# Patient Record
Sex: Male | Born: 1995 | Race: White | Hispanic: No | Marital: Single | Smoking: Never smoker
Health system: Southern US, Community
[De-identification: ages and names within clinical notes are randomized; demographics above are authoritative.]

## PROBLEM LIST (undated history)

## (undated) DIAGNOSIS — F909 Attention-deficit hyperactivity disorder, unspecified type: Secondary | ICD-10-CM

## (undated) HISTORY — DX: Attention-deficit hyperactivity disorder, unspecified type: F90.9

---
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
## 2017-06-12 ENCOUNTER — Ambulatory Visit (INDEPENDENT_AMBULATORY_CARE_PROVIDER_SITE_OTHER): Payer: BLUE CROSS/BLUE SHIELD | Admitting: Family

## 2017-06-12 ENCOUNTER — Encounter: Payer: Self-pay | Admitting: Family

## 2017-06-12 VITALS — BP 108/62 | HR 68 | Resp 16 | Ht 71.0 in | Wt 150.0 lb

## 2017-06-12 DIAGNOSIS — F900 Attention-deficit hyperactivity disorder, predominantly inattentive type: Secondary | ICD-10-CM

## 2017-06-12 DIAGNOSIS — Z79899 Other long term (current) drug therapy: Secondary | ICD-10-CM

## 2017-06-12 DIAGNOSIS — Z719 Counseling, unspecified: Secondary | ICD-10-CM | POA: Diagnosis not present

## 2017-06-12 MED ORDER — METHYLPHENIDATE HCL ER (OSM) 27 MG PO TBCR: 27.0000 mg | EXTENDED_RELEASE_TABLET | Freq: Every morning | ORAL | 0 refills | Status: AC

## 2017-06-12 NOTE — Progress Notes (Signed)
[facility] [address], [address] [place] [address] Dept: [phone] Dept Fax: [phone] Loc: [phone] Loc Fax: [phone]  Medical Follow-up  Patient ID: Joshua BoardsNicholas Haynes, male  DOB: 27-Nov-1995, 21 y.o.  MRN: [id number]  Date of Evaluation: 06/12/17  PCP: Joshua HeysEhinger, Robert, MD  Accompanied by: Self Patient Lives with: parents for the summer  HISTORY/CURRENT STATUS:  HPI  Patient here for routine follow up related to ADHD and medication management. Patient here by himself for today's visit and interactive with provider. Did well last year at [employer] with no academic difficulties. Not consistent with taking medication unless he needs it and has not changed doses. To continue with Concerta 27 mg daily with no side effects reported.   EDUCATION: School: [employer] Year/Grade: Sophomore  Performance/Grades: above average Services: Other: tutoring as needed Activities/Exercise: intermittently-working most days this summer at [employer]. Basketball at college most days.   MEDICAL HISTORY: Appetite: Good MVI/Other: Daily Fruits/Vegs:Some Calcium: Some Iron:Some  Sleep: Bedtime: 8 hours/night Sleep Concerns: Initiation/Maintenance/Other: Struggling to fall asleep and remain asleep, especially at school. Melatonin as needed.   Individual Medical History/Review of System Changes? Yes, attacked by bees yesterday and took Benadryl. No recent sicknesses or illnesses reported by patient.   Allergies: Patient has no known allergies.  Current Medications:  Current Outpatient Prescriptions:  .  methylphenidate 27 MG PO CR tablet, Take 1-2 tablets (27-54 mg total) by mouth every morning., Disp: 60 tablet, Rfl: 0 Medication Side Effects: None  Family Medical/Social History Changes?: None reported  MENTAL HEALTH: Mental Health Issues: None  reported  PHYSICAL EXAM: Vitals:  Today's Vitals   06/12/17 1007  BP: 108/62  Pulse: 68  Resp: 16  Weight: 150 lb (68 kg)  Height: 5\' 11"  (1.803 m)  PainSc: 0-No pain  , 25 %ile (Z= -0.66) based on CDC 2-20 Years BMI-for-age data using vitals from 06/12/2017.  General Exam: Physical Exam  Constitutional: He is oriented to person, place, and time. He appears well-developed and well-nourished.  HENT:  Head: Normocephalic and atraumatic.  Right Ear: External ear normal.  Left Ear: External ear normal.  Nose: Nose normal.  Mouth/Throat: Oropharynx is clear and moist.  Eyes: Pupils are equal, round, and reactive to light. Conjunctivae and EOM are normal.  Neck: Trachea normal, normal range of motion and full passive range of motion without pain. Neck supple.  Cardiovascular: Normal rate, regular rhythm, normal heart sounds and intact distal pulses.   Pulmonary/Chest: Effort normal and breath sounds normal.  Abdominal: Soft. Bowel sounds are normal.  Genitourinary:  Genitourinary Comments: Deferred  Musculoskeletal: Normal range of motion.  Neurological: He is alert and oriented to person, place, and time. He has normal reflexes.  Skin: Skin is warm, dry and intact. Capillary refill takes less than 2 seconds.  Psychiatric: He has a normal mood and affect. His behavior is normal. Judgment and thought content normal.  Vitals reviewed.  Review of Systems  Psychiatric/Behavioral: Positive for decreased concentration.  All other systems reviewed and are negative.  No concerns for toileting. Daily stool, no constipation or diarrhea. Void urine no difficulty. No enuresis.   Participate in daily oral hygiene to include brushing and flossing.  Neurological: oriented to time, place, and person Cranial Nerves: normal  Neuromuscular:  Motor Mass: Normal Tone: Normal Strength: Normal DTRs: 2+ and symmetric Overflow: None Reflexes: no tremors noted Sensory Exam: Vibratory: Intact  Fine  Touch:  Intact  Testing/Developmental Screens:  ASRS-did not complete at today's visit  DIAGNOSES:    ICD-10-CM   1. ADHD (attention deficit hyperactivity disorder), inattentive type F90.0   2. Medication management Z79.899   3. Patient counseled Z71.9     RECOMMENDATIONS: 3 month follow up and continuation of medication. Concerta 27 mg daily 1-2 daily, # 60 no refills and printed for patient today. No other refills given to patient.  Counseled patient on medication adherence with his medication. Patient only taking as needed and days with increased lecture time. Discussed need for consistency with his medication.   Reviewed medication safety at school with access being limited by other people in the dorm room and keeping medication in a lock box with only a small amount available at one time.   Information reviewed for increased physical activity during school days with recreational basketball and some other leisure activities. Patient to take Badminton this coming semester, but will continue with other activities as well.   Directed patient to follow up with PCP yearly, dentist as recommended, eye doctor for exam, MVI daily, healthy eating habits, exercise and proper rest for health maintenance.   NEXT APPOINTMENT: Return in about 3 months (around 09/12/2017) for follow up visit.  More than 50% of the appointment was spent counseling and discussing diagnosis and management of symptoms with the patient and family.  Carron Curieawn M Paretta-Leahey, NP Counseling Time: 30 mins Total Contact Time: 40 ins

## 2017-09-30 DIAGNOSIS — H9390 Unspecified disorder of ear, unspecified ear: Secondary | ICD-10-CM | POA: Diagnosis not present

## 2017-10-02 DIAGNOSIS — H60332 Swimmer's ear, left ear: Secondary | ICD-10-CM | POA: Diagnosis not present

## 2017-10-27 DIAGNOSIS — H60332 Swimmer's ear, left ear: Secondary | ICD-10-CM | POA: Diagnosis not present

## 2018-11-07 DIAGNOSIS — Z23 Encounter for immunization: Secondary | ICD-10-CM | POA: Diagnosis not present

## 2019-06-08 DIAGNOSIS — H6123 Impacted cerumen, bilateral: Secondary | ICD-10-CM | POA: Diagnosis not present

## 2019-10-26 DIAGNOSIS — Z23 Encounter for immunization: Secondary | ICD-10-CM | POA: Diagnosis not present
